# Patient Record
Sex: Male | Born: 1975 | Race: White | Hispanic: No | Marital: Married | Smoking: Never smoker
Health system: Southern US, Community
[De-identification: ages and names within clinical notes are randomized; demographics above are authoritative.]

## PROBLEM LIST (undated history)

## (undated) DIAGNOSIS — L4050 Arthropathic psoriasis, unspecified: Secondary | ICD-10-CM

## (undated) DIAGNOSIS — T7840XA Allergy, unspecified, initial encounter: Secondary | ICD-10-CM

## (undated) DIAGNOSIS — M722 Plantar fascial fibromatosis: Secondary | ICD-10-CM

## (undated) HISTORY — DX: Plantar fascial fibromatosis: M72.2

## (undated) HISTORY — PX: HERNIA REPAIR: SHX51

## (undated) HISTORY — PX: REFRACTIVE SURGERY: SHX103

## (undated) HISTORY — DX: Allergy, unspecified, initial encounter: T78.40XA

## (undated) HISTORY — DX: Arthropathic psoriasis, unspecified: L40.50

---

## 2013-11-14 DIAGNOSIS — J309 Allergic rhinitis, unspecified: Secondary | ICD-10-CM | POA: Insufficient documentation

## 2015-09-22 DIAGNOSIS — J029 Acute pharyngitis, unspecified: Secondary | ICD-10-CM | POA: Diagnosis not present

## 2015-09-22 DIAGNOSIS — R52 Pain, unspecified: Secondary | ICD-10-CM | POA: Diagnosis not present

## 2015-09-29 DIAGNOSIS — J3081 Allergic rhinitis due to animal (cat) (dog) hair and dander: Secondary | ICD-10-CM | POA: Diagnosis not present

## 2015-09-29 DIAGNOSIS — J301 Allergic rhinitis due to pollen: Secondary | ICD-10-CM | POA: Diagnosis not present

## 2015-09-29 DIAGNOSIS — J3089 Other allergic rhinitis: Secondary | ICD-10-CM | POA: Diagnosis not present

## 2015-10-03 DIAGNOSIS — J301 Allergic rhinitis due to pollen: Secondary | ICD-10-CM | POA: Diagnosis not present

## 2015-10-03 DIAGNOSIS — J3089 Other allergic rhinitis: Secondary | ICD-10-CM | POA: Diagnosis not present

## 2015-10-03 DIAGNOSIS — J3081 Allergic rhinitis due to animal (cat) (dog) hair and dander: Secondary | ICD-10-CM | POA: Diagnosis not present

## 2015-10-07 DIAGNOSIS — J3089 Other allergic rhinitis: Secondary | ICD-10-CM | POA: Diagnosis not present

## 2015-10-07 DIAGNOSIS — J301 Allergic rhinitis due to pollen: Secondary | ICD-10-CM | POA: Diagnosis not present

## 2015-10-07 DIAGNOSIS — J3081 Allergic rhinitis due to animal (cat) (dog) hair and dander: Secondary | ICD-10-CM | POA: Diagnosis not present

## 2015-10-10 DIAGNOSIS — J301 Allergic rhinitis due to pollen: Secondary | ICD-10-CM | POA: Diagnosis not present

## 2015-10-10 DIAGNOSIS — J3089 Other allergic rhinitis: Secondary | ICD-10-CM | POA: Diagnosis not present

## 2015-10-10 DIAGNOSIS — J3081 Allergic rhinitis due to animal (cat) (dog) hair and dander: Secondary | ICD-10-CM | POA: Diagnosis not present

## 2015-10-15 DIAGNOSIS — J3081 Allergic rhinitis due to animal (cat) (dog) hair and dander: Secondary | ICD-10-CM | POA: Diagnosis not present

## 2015-10-15 DIAGNOSIS — J3089 Other allergic rhinitis: Secondary | ICD-10-CM | POA: Diagnosis not present

## 2015-10-15 DIAGNOSIS — J301 Allergic rhinitis due to pollen: Secondary | ICD-10-CM | POA: Diagnosis not present

## 2015-10-21 DIAGNOSIS — J3089 Other allergic rhinitis: Secondary | ICD-10-CM | POA: Diagnosis not present

## 2015-10-21 DIAGNOSIS — J3081 Allergic rhinitis due to animal (cat) (dog) hair and dander: Secondary | ICD-10-CM | POA: Diagnosis not present

## 2015-10-21 DIAGNOSIS — J301 Allergic rhinitis due to pollen: Secondary | ICD-10-CM | POA: Diagnosis not present

## 2015-11-03 DIAGNOSIS — J301 Allergic rhinitis due to pollen: Secondary | ICD-10-CM | POA: Diagnosis not present

## 2015-11-03 DIAGNOSIS — J3089 Other allergic rhinitis: Secondary | ICD-10-CM | POA: Diagnosis not present

## 2015-11-03 DIAGNOSIS — H1045 Other chronic allergic conjunctivitis: Secondary | ICD-10-CM | POA: Diagnosis not present

## 2015-11-03 DIAGNOSIS — J3081 Allergic rhinitis due to animal (cat) (dog) hair and dander: Secondary | ICD-10-CM | POA: Diagnosis not present

## 2015-11-11 DIAGNOSIS — J301 Allergic rhinitis due to pollen: Secondary | ICD-10-CM | POA: Diagnosis not present

## 2015-11-11 DIAGNOSIS — J3081 Allergic rhinitis due to animal (cat) (dog) hair and dander: Secondary | ICD-10-CM | POA: Diagnosis not present

## 2015-11-11 DIAGNOSIS — J3089 Other allergic rhinitis: Secondary | ICD-10-CM | POA: Diagnosis not present

## 2015-11-21 DIAGNOSIS — J301 Allergic rhinitis due to pollen: Secondary | ICD-10-CM | POA: Diagnosis not present

## 2015-11-21 DIAGNOSIS — J3089 Other allergic rhinitis: Secondary | ICD-10-CM | POA: Diagnosis not present

## 2015-11-21 DIAGNOSIS — J3081 Allergic rhinitis due to animal (cat) (dog) hair and dander: Secondary | ICD-10-CM | POA: Diagnosis not present

## 2015-11-27 DIAGNOSIS — J3089 Other allergic rhinitis: Secondary | ICD-10-CM | POA: Diagnosis not present

## 2015-11-27 DIAGNOSIS — J301 Allergic rhinitis due to pollen: Secondary | ICD-10-CM | POA: Diagnosis not present

## 2015-11-27 DIAGNOSIS — J3081 Allergic rhinitis due to animal (cat) (dog) hair and dander: Secondary | ICD-10-CM | POA: Diagnosis not present

## 2015-12-03 DIAGNOSIS — J301 Allergic rhinitis due to pollen: Secondary | ICD-10-CM | POA: Diagnosis not present

## 2015-12-03 DIAGNOSIS — J3089 Other allergic rhinitis: Secondary | ICD-10-CM | POA: Diagnosis not present

## 2015-12-03 DIAGNOSIS — J3081 Allergic rhinitis due to animal (cat) (dog) hair and dander: Secondary | ICD-10-CM | POA: Diagnosis not present

## 2015-12-05 DIAGNOSIS — Z79899 Other long term (current) drug therapy: Secondary | ICD-10-CM | POA: Diagnosis not present

## 2015-12-05 DIAGNOSIS — L409 Psoriasis, unspecified: Secondary | ICD-10-CM | POA: Diagnosis not present

## 2015-12-05 DIAGNOSIS — M79674 Pain in right toe(s): Secondary | ICD-10-CM | POA: Diagnosis not present

## 2015-12-05 DIAGNOSIS — L4050 Arthropathic psoriasis, unspecified: Secondary | ICD-10-CM | POA: Diagnosis not present

## 2015-12-10 DIAGNOSIS — M79674 Pain in right toe(s): Secondary | ICD-10-CM | POA: Diagnosis not present

## 2015-12-10 DIAGNOSIS — M19071 Primary osteoarthritis, right ankle and foot: Secondary | ICD-10-CM | POA: Diagnosis not present

## 2015-12-15 DIAGNOSIS — J329 Chronic sinusitis, unspecified: Secondary | ICD-10-CM | POA: Diagnosis not present

## 2015-12-15 DIAGNOSIS — J029 Acute pharyngitis, unspecified: Secondary | ICD-10-CM | POA: Diagnosis not present

## 2015-12-17 DIAGNOSIS — J301 Allergic rhinitis due to pollen: Secondary | ICD-10-CM | POA: Diagnosis not present

## 2015-12-17 DIAGNOSIS — J3081 Allergic rhinitis due to animal (cat) (dog) hair and dander: Secondary | ICD-10-CM | POA: Diagnosis not present

## 2015-12-17 DIAGNOSIS — J3089 Other allergic rhinitis: Secondary | ICD-10-CM | POA: Diagnosis not present

## 2015-12-19 DIAGNOSIS — J301 Allergic rhinitis due to pollen: Secondary | ICD-10-CM | POA: Diagnosis not present

## 2015-12-19 DIAGNOSIS — J3089 Other allergic rhinitis: Secondary | ICD-10-CM | POA: Diagnosis not present

## 2015-12-19 DIAGNOSIS — J3081 Allergic rhinitis due to animal (cat) (dog) hair and dander: Secondary | ICD-10-CM | POA: Diagnosis not present

## 2016-01-14 DIAGNOSIS — J3089 Other allergic rhinitis: Secondary | ICD-10-CM | POA: Diagnosis not present

## 2016-01-14 DIAGNOSIS — J301 Allergic rhinitis due to pollen: Secondary | ICD-10-CM | POA: Diagnosis not present

## 2016-01-14 DIAGNOSIS — J3081 Allergic rhinitis due to animal (cat) (dog) hair and dander: Secondary | ICD-10-CM | POA: Diagnosis not present

## 2016-01-30 DIAGNOSIS — J3089 Other allergic rhinitis: Secondary | ICD-10-CM | POA: Diagnosis not present

## 2016-01-30 DIAGNOSIS — J3081 Allergic rhinitis due to animal (cat) (dog) hair and dander: Secondary | ICD-10-CM | POA: Diagnosis not present

## 2016-01-30 DIAGNOSIS — J301 Allergic rhinitis due to pollen: Secondary | ICD-10-CM | POA: Diagnosis not present

## 2016-02-06 DIAGNOSIS — J3089 Other allergic rhinitis: Secondary | ICD-10-CM | POA: Diagnosis not present

## 2016-02-06 DIAGNOSIS — J3081 Allergic rhinitis due to animal (cat) (dog) hair and dander: Secondary | ICD-10-CM | POA: Diagnosis not present

## 2016-02-06 DIAGNOSIS — J301 Allergic rhinitis due to pollen: Secondary | ICD-10-CM | POA: Diagnosis not present

## 2016-04-06 DIAGNOSIS — D171 Benign lipomatous neoplasm of skin and subcutaneous tissue of trunk: Secondary | ICD-10-CM | POA: Diagnosis not present

## 2016-04-06 DIAGNOSIS — Z0001 Encounter for general adult medical examination with abnormal findings: Secondary | ICD-10-CM | POA: Diagnosis not present

## 2016-04-06 DIAGNOSIS — L4050 Arthropathic psoriasis, unspecified: Secondary | ICD-10-CM | POA: Diagnosis not present

## 2016-04-06 DIAGNOSIS — M67471 Ganglion, right ankle and foot: Secondary | ICD-10-CM | POA: Diagnosis not present

## 2016-04-06 DIAGNOSIS — Z23 Encounter for immunization: Secondary | ICD-10-CM | POA: Diagnosis not present

## 2016-04-13 DIAGNOSIS — M67471 Ganglion, right ankle and foot: Secondary | ICD-10-CM | POA: Diagnosis not present

## 2016-04-13 DIAGNOSIS — M19071 Primary osteoarthritis, right ankle and foot: Secondary | ICD-10-CM | POA: Diagnosis not present

## 2016-04-28 DIAGNOSIS — J0110 Acute frontal sinusitis, unspecified: Secondary | ICD-10-CM | POA: Diagnosis not present

## 2016-05-11 DIAGNOSIS — J209 Acute bronchitis, unspecified: Secondary | ICD-10-CM | POA: Diagnosis not present

## 2016-06-09 DIAGNOSIS — J0101 Acute recurrent maxillary sinusitis: Secondary | ICD-10-CM | POA: Diagnosis not present

## 2016-06-11 DIAGNOSIS — L409 Psoriasis, unspecified: Secondary | ICD-10-CM | POA: Diagnosis not present

## 2016-06-11 DIAGNOSIS — Z79899 Other long term (current) drug therapy: Secondary | ICD-10-CM | POA: Diagnosis not present

## 2016-06-11 DIAGNOSIS — L4050 Arthropathic psoriasis, unspecified: Secondary | ICD-10-CM | POA: Diagnosis not present

## 2016-09-22 DIAGNOSIS — R0789 Other chest pain: Secondary | ICD-10-CM | POA: Diagnosis not present

## 2017-02-10 DIAGNOSIS — L409 Psoriasis, unspecified: Secondary | ICD-10-CM | POA: Diagnosis not present

## 2017-02-10 DIAGNOSIS — L4050 Arthropathic psoriasis, unspecified: Secondary | ICD-10-CM | POA: Diagnosis not present

## 2017-02-10 DIAGNOSIS — Z23 Encounter for immunization: Secondary | ICD-10-CM | POA: Diagnosis not present

## 2017-02-10 DIAGNOSIS — Z79899 Other long term (current) drug therapy: Secondary | ICD-10-CM | POA: Diagnosis not present

## 2017-03-06 DIAGNOSIS — M7731 Calcaneal spur, right foot: Secondary | ICD-10-CM | POA: Diagnosis not present

## 2017-03-06 DIAGNOSIS — M79671 Pain in right foot: Secondary | ICD-10-CM | POA: Diagnosis not present

## 2017-03-06 DIAGNOSIS — M26622 Arthralgia of left temporomandibular joint: Secondary | ICD-10-CM | POA: Diagnosis not present

## 2017-03-28 DIAGNOSIS — R030 Elevated blood-pressure reading, without diagnosis of hypertension: Secondary | ICD-10-CM | POA: Diagnosis not present

## 2017-03-28 DIAGNOSIS — J320 Chronic maxillary sinusitis: Secondary | ICD-10-CM | POA: Diagnosis not present

## 2017-05-13 DIAGNOSIS — Z713 Dietary counseling and surveillance: Secondary | ICD-10-CM | POA: Diagnosis not present

## 2017-05-13 DIAGNOSIS — Z136 Encounter for screening for cardiovascular disorders: Secondary | ICD-10-CM | POA: Diagnosis not present

## 2017-05-13 DIAGNOSIS — Z13220 Encounter for screening for lipoid disorders: Secondary | ICD-10-CM | POA: Diagnosis not present

## 2017-05-13 DIAGNOSIS — Z131 Encounter for screening for diabetes mellitus: Secondary | ICD-10-CM | POA: Diagnosis not present

## 2017-06-20 ENCOUNTER — Encounter: Payer: Self-pay | Admitting: Sports Medicine

## 2017-06-20 ENCOUNTER — Ambulatory Visit (INDEPENDENT_AMBULATORY_CARE_PROVIDER_SITE_OTHER): Payer: BLUE CROSS/BLUE SHIELD | Admitting: Sports Medicine

## 2017-06-20 DIAGNOSIS — L4050 Arthropathic psoriasis, unspecified: Secondary | ICD-10-CM

## 2017-06-20 DIAGNOSIS — M65949 Unspecified synovitis and tenosynovitis, unspecified hand: Secondary | ICD-10-CM | POA: Insufficient documentation

## 2017-06-20 DIAGNOSIS — M722 Plantar fascial fibromatosis: Secondary | ICD-10-CM

## 2017-06-20 DIAGNOSIS — M659 Synovitis and tenosynovitis, unspecified: Secondary | ICD-10-CM

## 2017-06-20 NOTE — Assessment & Plan Note (Signed)
Guided injection. Air heel brace, rehab exercises, return for custom orthotics.

## 2017-06-20 NOTE — Assessment & Plan Note (Signed)
Injection as above 

## 2017-06-20 NOTE — Progress Notes (Signed)
Subjective:    I'm seeing this patient as a consultation for:  Dr. Francee GentileAldona Ziolkowska  CC: Right heel pain  HPI: This is a pleasant 42 year old male with a history of psoriatic arthritis, currently on Biologics.  His most recent medication has provided some good relief of his rash.  Unfortunately over the past several weeks he is developed severe pain on the plantar aspect of his right heel, and lesser so on the left.  Worse with the first few steps in the morning, severe, persistent.  Has tried some stretches, over-the-counter NSAIDs, without improvement.  Pain radiates into the back of the heel.  No constitutional symptoms.  He is also noted some swelling of his right fourth PIP.  Moderate, persistent.  I reviewed the past medical history, family history, social history, surgical history, and allergies today and no changes were needed.  Please see the problem list section below in epic for further details.  Past Medical History: No past medical history on file. Past Surgical History: Past Surgical History:  Procedure Laterality Date  . HERNIA REPAIR     Social History: Social History   Socioeconomic History  . Marital status: Married    Spouse name: Tawny HoppingJenna Radabaugh  . Number of children: 2  . Years of education: None  . Highest education level: None  Social Needs  . Financial resource strain: None  . Food insecurity - worry: None  . Food insecurity - inability: None  . Transportation needs - medical: None  . Transportation needs - non-medical: None  Occupational History  . 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 Smoking status: Never Smoker  . Smokeless tobacco: Never Used  Substance and Sexual Activity  . Alcohol use: Yes  . Drug use: No  . Sexual activity: Yes  Other Topics Concern  . None  Social History Narrative  . None   Family History: History reviewed. No pertinent family history. Allergies: No Known Allergies Medications: See med  rec.  Review of Systems: No headache, visual changes, nausea, vomiting, diarrhea, constipation, dizziness, abdominal pain, skin rash, fevers, chills, night sweats, weight loss, swollen lymph nodes, body aches, joint swelling, muscle aches, chest pain, shortness of breath, mood changes, visual or auditory hallucinations.   Objective:   General: Well Developed, well nourished, and in no acute distress.  Neuro:  Extra-ocular muscles intact, able to move all 4 extremities, sensation grossly intact.  Deep tendon reflexes tested were normal. Psych: Alert and oriented, mood congruent with affect. ENT:  Ears and nose appear unremarkable.  Hearing grossly normal. Neck: Unremarkable overall appearance, trachea midline.  No visible thyroid enlargement. Eyes: Conjunctivae and lids appear unremarkable.  Pupils equal and round. Skin: Warm and dry, no rashes noted.  Cardiovascular: Pulses palpable, no extremity edema. Right foot: No visible erythema or swelling. Range of motion is full in all directions. Strength is 5/5 in all directions. No hallux valgus. No pes cavus or pes planus. No abnormal callus noted. No pain over the navicular prominence, or base of fifth metatarsal. Severe tenderness to palpation of the calcaneal insertion of plantar fascia. No pain at the Achilles insertion. No pain over the calcaneal bursa. No pain of the retrocalcaneal bursa. No tenderness to palpation over the tarsals, metatarsals, or phalanges. No hallux rigidus or limitus. No tenderness palpation over interphalangeal joints. No pain with compression of the metatarsal heads. Neurovascularly intact distally. Right hand: Fusiform swelling with palpable synovitis of the right fourth PIP.  No overlying erythema.  Procedure: Real-time Ultrasound Guided Injection of right plantar fascia Device: GE Logiq E  Verbal informed consent obtained.  Time-out conducted.  Noted no overlying erythema, induration, or other signs of  local infection.  Skin prepped in a sterile fashion.  Local anesthesia: Topical Ethyl chloride.  With sterile technique and under real time ultrasound guidance: Using a 25-gauge needle advanced just deep to the origin of the plantar fascia at the calcaneus, and injected 1 cc kenalog 40, 1 cc lidocaine, 1 cc bupivacaine. Completed without difficulty  Pain immediately resolved suggesting accurate placement of the medication.  Advised to call if fevers/chills, erythema, induration, drainage, or persistent bleeding.  Images permanently stored and available for review in the ultrasound unit.  Impression: Technically successful ultrasound guided injection.  Procedure: Real-time Ultrasound Guided Injection of right fourth PIP Device: GE Logiq E  Verbal informed consent obtained.  Time-out conducted.  Noted no overlying erythema, induration, or other signs of local infection.  Skin prepped in a sterile fashion.  Local anesthesia: Topical Ethyl chloride.  With sterile technique and under real time ultrasound guidance: 1/2 cc kenalog 40, 1/2 cc lidocaine injected easily, noted copious synovitis about this joint. Completed without difficulty  Pain immediately resolved suggesting accurate placement of the medication.  Advised to call if fevers/chills, erythema, induration, drainage, or persistent bleeding.  Images permanently stored and available for review in the ultrasound unit.  Impression: Technically successful ultrasound guided injection.  Impression and Recommendations:   This case required medical decision making of moderate complexity.  Synovitis of right fourth PIP Injection as above.  Plantar fasciitis, right Guided injection. Air heel brace, rehab exercises, return for custom orthotics. ___________________________________________ Ihor Austin. Benjamin Stain, M.D., ABFM., CAQSM. Primary Care and Sports Medicine [facility]  Adjunct Instructor of Family Medicine   [employer]

## 2017-06-24 ENCOUNTER — Ambulatory Visit (INDEPENDENT_AMBULATORY_CARE_PROVIDER_SITE_OTHER): Payer: BLUE CROSS/BLUE SHIELD | Admitting: Sports Medicine

## 2017-06-24 ENCOUNTER — Encounter: Payer: Self-pay | Admitting: Physician Assistant

## 2017-06-24 ENCOUNTER — Ambulatory Visit (INDEPENDENT_AMBULATORY_CARE_PROVIDER_SITE_OTHER): Payer: BLUE CROSS/BLUE SHIELD | Admitting: Physician Assistant

## 2017-06-24 VITALS — BP 129/87 | HR 97 | Ht 70.0 in | Wt 211.0 lb

## 2017-06-24 DIAGNOSIS — E782 Mixed hyperlipidemia: Secondary | ICD-10-CM

## 2017-06-24 DIAGNOSIS — M65949 Unspecified synovitis and tenosynovitis, unspecified hand: Secondary | ICD-10-CM

## 2017-06-24 DIAGNOSIS — R030 Elevated blood-pressure reading, without diagnosis of hypertension: Secondary | ICD-10-CM

## 2017-06-24 DIAGNOSIS — E6609 Other obesity due to excess calories: Secondary | ICD-10-CM | POA: Diagnosis not present

## 2017-06-24 DIAGNOSIS — M659 Synovitis and tenosynovitis, unspecified: Secondary | ICD-10-CM

## 2017-06-24 DIAGNOSIS — L4050 Arthropathic psoriasis, unspecified: Secondary | ICD-10-CM

## 2017-06-24 DIAGNOSIS — M722 Plantar fascial fibromatosis: Secondary | ICD-10-CM | POA: Diagnosis not present

## 2017-06-24 DIAGNOSIS — Z7689 Persons encountering health services in other specified circumstances: Secondary | ICD-10-CM | POA: Diagnosis not present

## 2017-06-24 MED ORDER — MELOXICAM 15 MG PO TABS: ORAL_TABLET | ORAL | 3 refills | Status: DC

## 2017-06-24 NOTE — Assessment & Plan Note (Signed)
Resolved after injection 

## 2017-06-24 NOTE — Progress Notes (Signed)
    Patient was fitted for a : standard, cushioned, semi-rigid orthotic. The orthotic was heated and afterward the patient stood on the orthotic blank positioned on the orthotic stand. The patient was positioned in subtalar neutral position and 10 degrees of ankle dorsiflexion in a weight bearing stance. After completion of molding, a stable base was applied to the orthotic blank. The blank was ground to a stable position for weight bearing. Size: 13 Base: White EVA Additional Posting and Padding: None The patient ambulated these, and they were very comfortable.  I spent 40 minutes with this patient, greater than 50% was face-to-face time counseling regarding the below diagnosis.  ___________________________________________ Thomas J. Thekkekandam, M.D., ABFM., CAQSM. Primary Care and Sports Medicine [facility]  Adjunct Instructor of Family Medicine  [employer]   

## 2017-06-24 NOTE — Assessment & Plan Note (Signed)
50% better after injection, continue air heel brace, custom orthotics created as above. Adding meloxicam, return in 1 month.

## 2017-06-24 NOTE — Progress Notes (Signed)
HPI:                                                                Craig Campbell is a 42 y.o. male who presents to [facility]: Primary Care Sports Medicine today to establish care  Current concerns: none   Depression screen [facility] 2/9 06/24/2017  Decreased Interest 0  Down, Depressed, Hopeless 0  PHQ - 2 Score 0    No flowsheet data found.    Past Medical History:  Diagnosis Date  . Allergy   . Plantar fasciitis   . Psoriatic arthritis [facility])    Past Surgical History:  Procedure Laterality Date  . HERNIA REPAIR    . REFRACTIVE SURGERY     Social History   Tobacco Use  . Smoking status: Never Smoker  . Smokeless tobacco: Never Used  Substance Use Topics  . Alcohol use: Yes    Alcohol/week: 1.2 oz    Types: 2 Standard drinks or equivalent per week   family history includes CAD in his paternal uncle; Depression in his mother; Diabetes in his mother; Heart attack in his paternal uncle; Hyperlipidemia in his mother; Hypertension in his mother; Stroke in his mother.    ROS: negative except as noted in the HPI  Medications: Current Outpatient Medications  Medication Sig Dispense Refill  . Secukinumab 150 MG/ML SOAJ Inject into the skin.     No current facility-administered medications for this visit.    No Known Allergies     Objective:  BP 129/87   Pulse 97   Ht 5\' 10"  (1.778 m)   Wt 211 lb (95.7 kg)   BMI 30.28 kg/m  Gen:  alert, not ill-appearing, no distress, appropriate for age, obese male HEENT: head normocephalic without obvious abnormality, conjunctiva and cornea clear, trachea midline Pulm: Normal work of breathing, normal phonation Neuro: alert and oriented x 3, no tremor MSK: extremities atraumatic, normal gait and station Skin: intact, no rashes on exposed skin, no jaundice, no cyanosis Psych: well-groomed, cooperative, good eye contact, euthymic mood, affect mood-congruent, speech is articulate, and thought processes clear  and goal-directed    No results found for this or any previous visit (from the past 72 hour(s)). No results found.    Assessment and Plan: 42 y.o. male with   1. Encounter to establish care - reviewed PMh, PSH, PFH, medications and allergies - reviewed health maintenance - personally reviewed labs in Care Everywhere from 12.2018 - influenza UTD - negative PHQ9  2. Psoriatic arthritis (HCC) Followed by Dr. Francee Gentile, [place]  3. Elevated blood pressure reading BP Readings from Last 3 Encounters:  06/24/17 129/87  06/24/17 129/87  06/20/17 (!) 145/66  - active surveillance, follow-up every 6 months - counseled on therapeutic lifestyle changes   4. Class 1 obesity due to excess calories without serious comorbidity in adult, unspecified BMI Wt Readings from Last 3 Encounters:  06/24/17 211 lb (95.7 kg)  06/24/17 211 lb (95.7 kg)  06/20/17 212 lb (96.2 kg)  - counseled on weight loss through decreasing caloric intake and increasing aerobic exercise - 1800 calorie DASH/Mediterranean diet - minimum 120 minutes of cardio per week   5. Mixed hyperlipidemia - 10 yr ASCVD risk 1.9% - LDL 160, candidate for  statin therapy - counseled on therapeutic lifestyle changes - recheck fasting lipids in 6 months     Patient education and anticipatory guidance given Patient agrees with treatment plan Follow-up in 6 months or sooner as needed if symptoms worsen or fail to improve  Levonne Hubertharley E. Jermale Crass PA-C

## 2017-06-24 NOTE — Patient Instructions (Addendum)
I have also ordered fasting labs. The lab is a walk-in open M-F 7:30a-4:30p (closed 12:30-1:30p). Nothing to eat or drink after midnight or at least 8 hours before your blood draw. You can have water and your medications.   For your blood pressure: - Goal <130/80 - monitor and log blood pressures at home - check around the same time each day in a relaxed setting - Limit salt to <2000 mg/day - Follow DASH eating plan - limit alcohol to 2 standard drinks per day for men and 1 per day for women - avoid tobacco products - weight loss: 7% of current body weight - follow-up every 6 months for your blood pressure    Physical Activity Recommendations for modifying lipids and lowering blood pressure Engage in aerobic physical activity to reduce LDL-cholesterol, non-HDL-cholesterol, and blood pressure  Frequency: 3-4 sessions per week  Intensity: moderate to vigorous  Duration: 40 minutes on average  Physical Activity Recommendations for secondary prevention 1. Aerobic exercise  Frequency: 3-5 sessions per week  Intensity: 50-80% capacity  Duration: 20 - 60 minutes  Examples: walking, treadmill, cycling, rowing, stair climbing, and arm/leg ergometry  2. Resistance exercise  Frequency: 2-3 sessions per week  Intensity: 10-15 repetitions/set to moderate fatigue  Duration: 1-3 sets of 8-10 upper and lower body exercises  Examples: calisthenics, elastic bands, cuff/hand weights, dumbbels, free weights, wall pulleys, and weight machines  Heart-Healthy Lifestyle  Eating a diet rich in vegetables, fruits and whole grains: also includes low-fat dairy products, poultry, fish, legumes, and nuts; limit intake of sweets, sugar-sweetened beverages and red meats  Getting regular exercise  Maintaining a healthy weight  Not smoking or getting help quitting  Staying on top of your health; for some people, lifestyle changes alone may not be enough to prevent a heart attack or stroke. In  these cases, taking a statin at the right dose will most likely be necessary

## 2017-07-22 ENCOUNTER — Ambulatory Visit (INDEPENDENT_AMBULATORY_CARE_PROVIDER_SITE_OTHER): Payer: BLUE CROSS/BLUE SHIELD | Admitting: Sports Medicine

## 2017-07-22 ENCOUNTER — Ambulatory Visit (INDEPENDENT_AMBULATORY_CARE_PROVIDER_SITE_OTHER): Payer: BLUE CROSS/BLUE SHIELD

## 2017-07-22 ENCOUNTER — Encounter: Payer: Self-pay | Admitting: Sports Medicine

## 2017-07-22 VITALS — BP 128/87 | HR 112 | Resp 18 | Wt 206.0 lb

## 2017-07-22 DIAGNOSIS — M7731 Calcaneal spur, right foot: Secondary | ICD-10-CM

## 2017-07-22 DIAGNOSIS — M722 Plantar fascial fibromatosis: Secondary | ICD-10-CM

## 2017-07-22 DIAGNOSIS — M19071 Primary osteoarthritis, right ankle and foot: Secondary | ICD-10-CM | POA: Diagnosis not present

## 2017-07-22 DIAGNOSIS — J020 Streptococcal pharyngitis: Secondary | ICD-10-CM | POA: Insufficient documentation

## 2017-07-22 DIAGNOSIS — J029 Acute pharyngitis, unspecified: Secondary | ICD-10-CM | POA: Diagnosis not present

## 2017-07-22 LAB — POCT RAPID STREP A (OFFICE): Rapid Strep A Screen: POSITIVE — AB

## 2017-07-22 MED ORDER — HYDROCODONE-ACETAMINOPHEN 5-325 MG PO TABS: 1.0000 | ORAL_TABLET | Freq: Three times a day (TID) | ORAL | 0 refills | Status: DC | PRN

## 2017-07-22 MED ORDER — PREDNISONE 50 MG PO TABS: ORAL_TABLET | ORAL | 0 refills | Status: DC

## 2017-07-22 MED ORDER — PENICILLIN G BENZATHINE 1200000 UNIT/2ML IM SUSP
1.2000 10*6.[IU] | Freq: Once | INTRAMUSCULAR | Status: AC
  Administered 2017-07-22: 1.2 10*6.[IU] via INTRAMUSCULAR

## 2017-07-22 NOTE — Progress Notes (Signed)
Subjective:    CC: Foot pain  HPI: This is a pleasant 42 year old male, he continues to have right foot pain, although is gone from his calcaneal insertion of his plantar fascia.  He now has pain over the medial and lateral aspects of the calcaneus, moderate, persistent without radiation.  Worse with weightbearing.  In addition he is developed a severe sore throat, his daughter was tested but negative for strep, he denies a cough, has significant fatigue, subjective constitutional symptoms.  I reviewed the past medical history, family history, social history, surgical history, and allergies today and no changes were needed.  Please see the problem list section below in epic for further details.  Past Medical History: Past Medical History:  Diagnosis Date  . Allergy   . Plantar fasciitis   . Psoriatic arthritis [facility])    Past Surgical History: Past Surgical History:  Procedure Laterality Date  . HERNIA REPAIR    . REFRACTIVE SURGERY     Social History: Social History   Socioeconomic History  . Marital status: Married    Spouse name: Mckyle Solanki  . Number of children: 2  . Years of education: None  . Highest education level: None  Social Needs  . Financial resource strain: None  . Food insecurity - worry: None  . Food insecurity - inability: None  . Transportation needs - medical: None  . Transportation needs - non-medical: None  Occupational History  . 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 Smoking status: Never Smoker  . Smokeless tobacco: Never Used  Substance and Sexual Activity  . Alcohol use: Yes    Alcohol/week: 1.2 oz    Types: 2 Standard drinks or equivalent per week  . Drug use: No  . Sexual activity: Yes  Other Topics Concern  . None  Social History Narrative  . None   Family History: Family History  Problem Relation Age of Onset  . Hyperlipidemia Mother   . Stroke Mother   . Hypertension Mother   . Diabetes Mother   .  Depression Mother   . Heart attack Paternal Uncle   . CAD Paternal Uncle    Allergies: No Known Allergies Medications: See med rec.  Review of Systems: No fevers, chills, night sweats, weight loss, chest pain, or shortness of breath.   Objective:    General: Well Developed, well nourished, and in no acute distress.  Neuro: Alert and oriented x3, extra-ocular muscles intact, sensation grossly intact.  HEENT: Normocephalic, atraumatic, pupils equal round reactive to light, neck supple, no masses, thyroid nonpalpable.  Nasopharynx, ear canals unremarkable, oropharynx shows erythematous, enlarged tonsils with visible exudates, there is also tender bilateral cervical lymphadenopathy Skin: Warm and dry, no rashes. Cardiac: Regular rate and rhythm, no murmurs rubs or gallops, no lower extremity edema.  Respiratory: Clear to auscultation bilaterally. Not using accessory muscles, speaking in full sentences. Right foot: No visible erythema or swelling. Range of motion is full in all directions. Strength is 5/5 in all directions. No hallux valgus. No pes cavus or pes planus. No abnormal callus noted. No pain over the navicular prominence, or base of fifth metatarsal. No tenderness to palpation of the calcaneal insertion of plantar fascia. No pain at the Achilles insertion. No pain over the calcaneal bursa. No pain of the retrocalcaneal bursa. Tender to palpation on the calcaneus with a positive calcaneal squeeze test. Hallux limitus with enlarged first MTP. No tenderness palpation over interphalangeal joints. No pain with compression of the  metatarsal heads. Neurovascularly intact distally.   Rapid strep test is positive.  Impression and Recommendations:    Plantar fasciitis, right Persistent pain at the right heel, this time with a positive calcaneal squeeze test. My concern is for calcaneal stress injury. He is currently finishing up the loading dose of his Cosentyx. Adding a 5-day  burst of prednisone, Cam boot, hydrocodone. Return to see me in 3 weeks. I am going to MRI his right foot.  Streptococcal pharyngitis Penicillin 1,200,000 units intramuscular. Wear a mask at work, follow-up with PCP for this. ___________________________________________ Ihor Austinhomas J. Benjamin Stainhekkekandam, M.D., ABFM., CAQSM. Primary Care and Sports Medicine [facility]  Adjunct Instructor of Family Medicine  [employer]

## 2017-07-22 NOTE — Assessment & Plan Note (Signed)
Penicillin 1,200,000 units intramuscular. Wear a mask at work, follow-up with PCP for this.

## 2017-07-22 NOTE — Assessment & Plan Note (Signed)
Persistent pain at the right heel, this time with a positive calcaneal squeeze test. My concern is for calcaneal stress injury. He is currently finishing up the loading dose of his Cosentyx. Adding a 5-day burst of prednisone, Cam boot, hydrocodone. Return to see me in 3 weeks. I am going to MRI his right foot.

## 2017-07-22 NOTE — Addendum Note (Signed)
Addended by: Monica BectonHEKKEKANDAM, THOMAS J on: 07/22/2017 04:33 PM   Modules accepted: Orders

## 2017-08-01 ENCOUNTER — Ambulatory Visit (INDEPENDENT_AMBULATORY_CARE_PROVIDER_SITE_OTHER): Payer: BLUE CROSS/BLUE SHIELD

## 2017-08-01 DIAGNOSIS — M722 Plantar fascial fibromatosis: Secondary | ICD-10-CM

## 2017-08-01 DIAGNOSIS — M7671 Peroneal tendinitis, right leg: Secondary | ICD-10-CM | POA: Diagnosis not present

## 2017-08-01 DIAGNOSIS — R600 Localized edema: Secondary | ICD-10-CM | POA: Diagnosis not present

## 2017-08-08 ENCOUNTER — Telehealth: Payer: Self-pay

## 2017-08-08 DIAGNOSIS — M722 Plantar fascial fibromatosis: Secondary | ICD-10-CM

## 2017-08-08 NOTE — Telephone Encounter (Signed)
Pt left VM asking for a refill of pain medication. Please advise.

## 2017-08-09 MED ORDER — HYDROCODONE-ACETAMINOPHEN 5-325 MG PO TABS: 1.0000 | ORAL_TABLET | Freq: Three times a day (TID) | ORAL | 0 refills | Status: DC | PRN

## 2017-08-09 NOTE — Telephone Encounter (Signed)
Pt.notified

## 2017-08-09 NOTE — Telephone Encounter (Signed)
Done

## 2017-08-15 ENCOUNTER — Ambulatory Visit (INDEPENDENT_AMBULATORY_CARE_PROVIDER_SITE_OTHER): Payer: BLUE CROSS/BLUE SHIELD | Admitting: Sports Medicine

## 2017-08-15 ENCOUNTER — Encounter: Payer: Self-pay | Admitting: Sports Medicine

## 2017-08-15 DIAGNOSIS — L4050 Arthropathic psoriasis, unspecified: Secondary | ICD-10-CM | POA: Diagnosis not present

## 2017-08-15 MED ORDER — PREDNISONE 10 MG (48) PO TBPK
ORAL_TABLET | Freq: Every day | ORAL | 0 refills | Status: DC
Start: 2017-08-15 — End: 2017-08-26

## 2017-08-15 NOTE — Assessment & Plan Note (Addendum)
With multiple edematous bones, plantar fasciitis, arthritis and synovitis on MRI this is likely a byproduct of uncontrolled psoriatic arthritis. He is going to be going up on his dose on Cosentyx. Adding some prednisone in a taper. Foot is very painful, we are going to proceed with 2 weeks of cast immobilization with a postop shoe for a walker. Return in 2 weeks for cast removal.

## 2017-08-15 NOTE — Progress Notes (Signed)
Subjective:    CC: Follow-up  HPI: Craig LericheMark returns to follow-up his MRI, he does have psoriatic arthritis, under inadequate control with low-dose Cosentyx.  We did a plantar fascia injection with mild relief, steroid burst with good relief, the MRI showed multiple pathologic changes likely related to psoriatic arthritis.  At this point he is had persistent pain, this time at the big toe but also at the heel.  Agreeable to do a cast.  He does have another appointment coming up for a second opinion with a different is getting a delivery with the higher dose of Cosentyx.   I reviewed the past medical history, family history, social history, surgical history, and allergies today and no changes were needed.  Please see the problem list section below in epic for further details.  Past Medical History: Past Medical History:  Diagnosis Date  . Allergy   . Plantar fasciitis   . Psoriatic arthritis [facility](HCC)    Past Surgical History: Past Surgical History:  Procedure Laterality Date  . HERNIA REPAIR    . REFRACTIVE SURGERY     Social History: Social History   Socioeconomic History  . Marital status: Married    Spouse name: Craig HoppingJenna Campbell  . Number of children: 2  . Years of education: None  . Highest education level: None  Social Needs  . Financial resource strain: None  . Food insecurity - worry: None  . Food insecurity - inability: None  . Transportation needs - medical: None  . Transportation needs - non-medical: None  Occupational History  . 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 Smoking status: Never Smoker  . Smokeless tobacco: Never Used  Substance and Sexual Activity  . Alcohol use: Yes    Alcohol/week: 1.2 oz    Types: 2 Standard drinks or equivalent per week  . Drug use: No  . Sexual activity: Yes  Other Topics Concern  . None  Social History Narrative  . None   Family History: Family History  Problem Relation Age of Onset  . Hyperlipidemia Mother     . Stroke Mother   . Hypertension Mother   . Diabetes Mother   . Depression Mother   . Heart attack Paternal Uncle   . CAD Paternal Uncle    Allergies: No Known Allergies Medications: See med rec.  Review of Systems: No fevers, chills, night sweats, weight loss, chest pain, or shortness of breath.   Objective:    General: Well Developed, well nourished, and in no acute distress.  Neuro: Alert and oriented x3, extra-ocular muscles intact, sensation grossly intact.  HEENT: Normocephalic, atraumatic, pupils equal round reactive to light, neck supple, no masses, no lymphadenopathy, thyroid nonpalpable.  Skin: Warm and dry, no rashes. Cardiac: Regular rate and rhythm, no murmurs rubs or gallops, no lower extremity edema.  Respiratory: Clear to auscultation bilaterally. Not using accessory muscles, speaking in full sentences. Right foot: No visible erythema or swelling. Range of motion is full in all directions. Strength is 5/5 in all directions. No hallux valgus. No pes cavus or pes planus. No abnormal callus noted. No pain over the navicular prominence, or base of fifth metatarsal. No tenderness to palpation of the calcaneal insertion of plantar fascia. No pain at the Achilles insertion. No pain over the calcaneal bursa. No pain of the retrocalcaneal bursa. Tenderness over the heel, first MTP, fifth MTP. No hallux rigidus or limitus. No tenderness palpation over interphalangeal joints. No pain with compression of the metatarsal  heads. Neurovascularly intact distally.  Short leg walking cast placed  Short leg walking cast placed.  Impression and Recommendations:    Psoriatic arthritis (HCC) With multiple edematous bones, plantar fasciitis, arthritis and synovitis on MRI this is likely a byproduct of uncontrolled psoriatic arthritis. He is going to be going up on his dose on Cosentyx. Adding some prednisone in a taper. Foot is very painful, we are going to proceed with 2  weeks of cast immobilization with a postop shoe for a walker. Return in 2 weeks for cast removal. ___________________________________________ Craig Campbell. Craig Campbell, M.D., ABFM., CAQSM. Primary Care and Sports Medicine [facility]  Adjunct Instructor of Family Medicine  [employer]

## 2017-08-17 DIAGNOSIS — L409 Psoriasis, unspecified: Secondary | ICD-10-CM | POA: Diagnosis not present

## 2017-08-17 DIAGNOSIS — Z79899 Other long term (current) drug therapy: Secondary | ICD-10-CM | POA: Diagnosis not present

## 2017-08-17 DIAGNOSIS — G4700 Insomnia, unspecified: Secondary | ICD-10-CM | POA: Diagnosis not present

## 2017-08-17 DIAGNOSIS — L4050 Arthropathic psoriasis, unspecified: Secondary | ICD-10-CM | POA: Diagnosis not present

## 2017-08-24 ENCOUNTER — Telehealth: Payer: Self-pay

## 2017-08-24 DIAGNOSIS — M722 Plantar fascial fibromatosis: Secondary | ICD-10-CM

## 2017-08-24 MED ORDER — HYDROCODONE-ACETAMINOPHEN 5-325 MG PO TABS: 1.0000 | ORAL_TABLET | Freq: Three times a day (TID) | ORAL | 0 refills | Status: DC | PRN

## 2017-08-24 NOTE — Telephone Encounter (Signed)
Pt would like a refill of pain meds. Please advise.

## 2017-08-24 NOTE — Telephone Encounter (Signed)
Done

## 2017-08-26 ENCOUNTER — Encounter: Payer: Self-pay | Admitting: Sports Medicine

## 2017-08-26 ENCOUNTER — Ambulatory Visit (INDEPENDENT_AMBULATORY_CARE_PROVIDER_SITE_OTHER): Payer: BLUE CROSS/BLUE SHIELD | Admitting: Sports Medicine

## 2017-08-26 DIAGNOSIS — L4050 Arthropathic psoriasis, unspecified: Secondary | ICD-10-CM | POA: Diagnosis not present

## 2017-08-26 NOTE — Progress Notes (Signed)
Subjective:    CC: Follow-up  HPI: Craig LericheMark returns, he is a pleasant 42 year old male, we saw him for severe ankle pain, after failure of conservative measures and MRI showed multiple edematous bones, plantar fasciitis, all likely complications and presentations of his psoriatic arthritis, he was initially on the low dose of Cosentyx, recently increased to a higher dose.  We had him with high-dose prednisone, and cast immobilization.  He returns today pain-free and eager to get the cast off.  I reviewed the past medical history, family history, social history, surgical history, and allergies today and no changes were needed.  Please see the problem list section below in epic for further details.  Past Medical History: Past Medical History:  Diagnosis Date  . Allergy   . Plantar fasciitis   . Psoriatic arthritis [facility](HCC)    Past Surgical History: Past Surgical History:  Procedure Laterality Date  . HERNIA REPAIR    . REFRACTIVE SURGERY     Social History: Social History   Socioeconomic History  . Marital status: Married    Spouse name: Tawny HoppingJenna Goral  . Number of children: 2  . Years of education: Not on file  . Highest education level: Not on file  Occupational History  . Occupation: Education officer, communityales Manager [employer]  Social Needs  . Financial resource strain: Not on file  . Food insecurity:    Worry: Not on file    Inability: Not on file  . Transportation needs:    Medical: Not on file    Non-medical: Not on file  Tobacco Use  . Smoking status: Never Smoker  . Smokeless tobacco: Never Used  Substance and Sexual Activity  . Alcohol use: Yes    Alcohol/week: 1.2 oz    Types: 2 Standard drinks or equivalent per week  . Drug use: No  . Sexual activity: Yes  Lifestyle  . Physical activity:    Days per week: Not on file    Minutes per session: Not on file  . Stress: Not on file  Relationships  . Social connections:    Talks on phone: Not on file    Gets together: Not on  file    Attends religious service: Not on file    Active member of club or organization: Not on file    Attends meetings of clubs or organizations: Not on file    Relationship status: Not on file  Other Topics Concern  . Not on file  Social History Narrative  . Not on file   Family History: Family History  Problem Relation Age of Onset  . Hyperlipidemia Mother   . Stroke Mother   . Hypertension Mother   . Diabetes Mother   . Depression Mother   . Heart attack Paternal Uncle   . CAD Paternal Uncle    Allergies: No Known Allergies Medications: See med rec.  Review of Systems: No fevers, chills, night sweats, weight loss, chest pain, or shortness of breath.   Objective:    General: Well Developed, well nourished, and in no acute distress.  Neuro: Alert and oriented x3, extra-ocular muscles intact, sensation grossly intact.  HEENT: Normocephalic, atraumatic, pupils equal round reactive to light, neck supple, no masses, no lymphadenopathy, thyroid nonpalpable.  Skin: Warm and dry, no rashes. Cardiac: Regular rate and rhythm, no murmurs rubs or gallops, no lower extremity edema.  Respiratory: Clear to auscultation bilaterally. Not using accessory muscles, speaking in full sentences. Right ankle: No visible erythema or swelling. Range of motion  is full in all directions. Strength is 5/5 in all directions. Stable lateral and medial ligaments; squeeze test and kleiger test unremarkable; Talar dome nontender; No pain at base of 5th MT; No tenderness over cuboid; No tenderness over N spot or navicular prominence No tenderness on posterior aspects of lateral and medial malleolus No sign of peroneal tendon subluxations; Negative tarsal tunnel tinel's Able to walk 4 steps.  Impression and Recommendations:    Psoriatic arthritis (HCC) Had multiple edematous bones, plantar fasciitis, arthritis and synovitis in the MRI likely a byproduct of his uncontrolled psoriatic  arthritis. Dose of Cosentyx was increased, we added prednisone in a taper as well as 2 weeks nearly of cast immobilization with a postop shoe. He returns today nearly completely symptom-free, cast is removed, doing well, keep additional follow-up with rheumatology.  I spent 25 minutes with this patient, greater than 50% was face-to-face time counseling regarding the above diagnoses ___________________________________________ Ihor Austin. Benjamin Stain, M.D., ABFM., CAQSM. Primary Care and Sports Medicine [facility]  Adjunct Instructor of Family Medicine  [employer]

## 2017-08-26 NOTE — Assessment & Plan Note (Signed)
Had multiple edematous bones, plantar fasciitis, arthritis and synovitis in the MRI likely a byproduct of his uncontrolled psoriatic arthritis. Dose of Cosentyx was increased, we added prednisone in a taper as well as 2 weeks nearly of cast immobilization with a postop shoe. He returns today nearly completely symptom-free, cast is removed, doing well, keep additional follow-up with rheumatology.

## 2017-08-29 ENCOUNTER — Ambulatory Visit: Payer: BLUE CROSS/BLUE SHIELD | Admitting: Sports Medicine

## 2017-09-05 ENCOUNTER — Telehealth: Payer: Self-pay

## 2017-09-05 DIAGNOSIS — M722 Plantar fascial fibromatosis: Secondary | ICD-10-CM

## 2017-09-05 MED ORDER — HYDROCODONE-ACETAMINOPHEN 5-325 MG PO TABS: 1.0000 | ORAL_TABLET | Freq: Three times a day (TID) | ORAL | 0 refills | Status: DC | PRN

## 2017-09-05 NOTE — Telephone Encounter (Signed)
Refilled but we need to start tapering down on quantity

## 2017-09-05 NOTE — Telephone Encounter (Signed)
Pt would like another refill of pain medications. Please advise

## 2017-09-20 ENCOUNTER — Telehealth: Payer: Self-pay | Admitting: Sports Medicine

## 2017-09-20 MED ORDER — TRAMADOL HCL 50 MG PO TABS: 50.0000 mg | ORAL_TABLET | Freq: Three times a day (TID) | ORAL | 0 refills | Status: AC | PRN

## 2017-09-20 NOTE — Telephone Encounter (Signed)
I am going to drop him down to tramadol, If he continues to need pain medication we probably need to enlist the help of a pain management provider.

## 2017-09-20 NOTE — Telephone Encounter (Signed)
Pt called and is wanting to know if he can get a refill on the hydrocodone. Thanks

## 2017-09-21 NOTE — Telephone Encounter (Signed)
Called and let pt know medication was sent to pharm and if he is continuing to have issue that he may need to go to pain management. Thanks

## 2017-09-26 ENCOUNTER — Telehealth: Payer: Self-pay | Admitting: Sports Medicine

## 2017-09-26 DIAGNOSIS — L4059 Other psoriatic arthropathy: Secondary | ICD-10-CM | POA: Diagnosis not present

## 2017-09-26 MED ORDER — PREDNISONE 10 MG (48) PO TBPK: ORAL_TABLET | Freq: Every day | ORAL | 0 refills | Status: AC

## 2017-09-26 NOTE — Telephone Encounter (Signed)
Received a call from [facility] boss, severe ankle pain.  Had multiple edematous bones, plantar fasciitis, arthritis and synovitis in the MRI, likely from uncontrolled psoriatic arthritis, he was pain-free about a month ago, now having a recurrence of discomfort, adding a taper of prednisone, he does need to get in with his rheumatologist as well to discuss better treatment.

## 2017-10-16 ENCOUNTER — Other Ambulatory Visit: Payer: Self-pay | Admitting: Sports Medicine

## 2017-10-16 DIAGNOSIS — M722 Plantar fascial fibromatosis: Secondary | ICD-10-CM

## 2017-10-17 NOTE — Telephone Encounter (Signed)
[facility] requesting RF on Mobic.  Last OV 08-26-17 Last RF 06-24-17 for # 30 with 3 RF  No upcoming appts scheduled  RX pended, please review request and send if appropriate.

## 2017-10-31 DIAGNOSIS — R0982 Postnasal drip: Secondary | ICD-10-CM | POA: Diagnosis not present

## 2017-10-31 DIAGNOSIS — R05 Cough: Secondary | ICD-10-CM | POA: Diagnosis not present

## 2017-12-28 DIAGNOSIS — M2550 Pain in unspecified joint: Secondary | ICD-10-CM | POA: Diagnosis not present

## 2017-12-28 DIAGNOSIS — L4059 Other psoriatic arthropathy: Secondary | ICD-10-CM | POA: Diagnosis not present

## 2018-01-12 DIAGNOSIS — H1031 Unspecified acute conjunctivitis, right eye: Secondary | ICD-10-CM | POA: Diagnosis not present

## 2018-01-12 DIAGNOSIS — B9689 Other specified bacterial agents as the cause of diseases classified elsewhere: Secondary | ICD-10-CM | POA: Diagnosis not present

## 2018-01-12 DIAGNOSIS — J069 Acute upper respiratory infection, unspecified: Secondary | ICD-10-CM | POA: Diagnosis not present

## 2018-03-14 ENCOUNTER — Emergency Department (HOSPITAL_BASED_OUTPATIENT_CLINIC_OR_DEPARTMENT_OTHER): Payer: BLUE CROSS/BLUE SHIELD

## 2018-03-14 ENCOUNTER — Other Ambulatory Visit: Payer: Self-pay

## 2018-03-14 ENCOUNTER — Encounter (HOSPITAL_BASED_OUTPATIENT_CLINIC_OR_DEPARTMENT_OTHER): Payer: Self-pay

## 2018-03-14 ENCOUNTER — Emergency Department (HOSPITAL_BASED_OUTPATIENT_CLINIC_OR_DEPARTMENT_OTHER)
Admission: EM | Admit: 2018-03-14 | Discharge: 2018-03-14 | Disposition: A | Discharge status: Home / Self Care | Payer: BLUE CROSS/BLUE SHIELD | Attending: Emergency Medicine | Admitting: Emergency Medicine

## 2018-03-14 DIAGNOSIS — R51 Headache: Secondary | ICD-10-CM | POA: Insufficient documentation

## 2018-03-14 DIAGNOSIS — L4050 Arthropathic psoriasis, unspecified: Secondary | ICD-10-CM | POA: Insufficient documentation

## 2018-03-14 DIAGNOSIS — R519 Headache, unspecified: Secondary | ICD-10-CM

## 2018-03-14 DIAGNOSIS — Z79899 Other long term (current) drug therapy: Secondary | ICD-10-CM | POA: Insufficient documentation

## 2018-03-14 LAB — BASIC METABOLIC PANEL
Anion gap: 11 (ref 5–15)
BUN: 14 mg/dL (ref 6–20)
CO2: 25 mmol/L (ref 22–32)
Calcium: 9.8 mg/dL (ref 8.9–10.3)
Chloride: 99 mmol/L (ref 98–111)
Creatinine, Ser: 0.76 mg/dL (ref 0.61–1.24)
GFR calc Af Amer: >60 mL/min (ref >60)
GFR calc non Af Amer: >60 mL/min (ref >60)
Glucose, Bld: 98 mg/dL (ref 70–99)
Potassium: 4 mmol/L (ref 3.5–5.1)
Sodium: 135 mmol/L (ref 135–145)

## 2018-03-14 LAB — CBC WITH DIFFERENTIAL/PLATELET
Abs Immature Granulocytes: 0.02 10*3/uL (ref 0.00–0.07)
Basophils Absolute: 0 10*3/uL (ref 0.0–0.1)
Basophils Relative: 1 %
Eosinophils Absolute: 0.1 10*3/uL (ref 0.0–0.5)
Eosinophils Relative: 1 %
HCT: 48.7 % (ref 39.0–52.0)
Hemoglobin: 16.5 g/dL (ref 13.0–17.0)
Immature Granulocytes: 0 %
Lymphocytes Relative: 24 %
Lymphs Abs: 1.2 10*3/uL (ref 0.7–4.0)
MCH: 31.7 pg (ref 26.0–34.0)
MCHC: 33.9 g/dL (ref 30.0–36.0)
MCV: 93.7 fL (ref 80.0–100.0)
Monocytes Absolute: 0.2 10*3/uL (ref 0.1–1.0)
Monocytes Relative: 4 %
Neutro Abs: 3.7 10*3/uL (ref 1.7–7.7)
Neutrophils Relative %: 70 %
Platelets: 218 10*3/uL (ref 150–400)
RBC: 5.2 MIL/uL (ref 4.22–5.81)
RDW: 12.6 % (ref 11.5–15.5)
WBC: 5.2 10*3/uL (ref 4.0–10.5)
nRBC: 0 % (ref 0.0–0.2)

## 2018-03-14 MED ORDER — IOPAMIDOL (ISOVUE-370) INJECTION 76%
100.0000 mL | Freq: Once | INTRAVENOUS | Status: AC | PRN
  Administered 2018-03-14: 80 mL via INTRAVENOUS

## 2018-03-14 MED ORDER — SODIUM CHLORIDE 0.9 % IV BOLUS
1000.0000 mL | Freq: Once | INTRAVENOUS | Status: AC
  Administered 2018-03-14: 1000 mL via INTRAVENOUS

## 2018-03-14 MED ORDER — PROCHLORPERAZINE EDISYLATE 10 MG/2ML IJ SOLN
10.0000 mg | Freq: Once | INTRAMUSCULAR | Status: AC
  Administered 2018-03-14: 10 mg via INTRAVENOUS
  Filled 2018-03-14: qty 2

## 2018-03-14 MED ORDER — DIPHENHYDRAMINE HCL 50 MG/ML IJ SOLN
INTRAMUSCULAR | Status: AC
  Filled 2018-03-14: qty 1

## 2018-03-14 MED ORDER — DIPHENHYDRAMINE HCL 50 MG/ML IJ SOLN
25.0000 mg | Freq: Once | INTRAMUSCULAR | Status: AC
  Administered 2018-03-14: 25 mg via INTRAVENOUS

## 2018-03-14 MED ORDER — KETOROLAC TROMETHAMINE 30 MG/ML IJ SOLN
30.0000 mg | Freq: Once | INTRAMUSCULAR | Status: AC
  Administered 2018-03-14: 30 mg via INTRAVENOUS
  Filled 2018-03-14: qty 1

## 2018-03-14 MED ORDER — DIPHENHYDRAMINE HCL 50 MG/ML IJ SOLN
25.0000 mg | Freq: Once | INTRAMUSCULAR | Status: AC
  Administered 2018-03-14: 25 mg via INTRAVENOUS
  Filled 2018-03-14: qty 1

## 2018-03-14 NOTE — ED Notes (Signed)
Patient transported to CT 

## 2018-03-14 NOTE — ED Triage Notes (Signed)
C/o left temporal HA x 2-3 hours-no meds taken PTA-steady gait

## 2018-03-14 NOTE — Discharge Instructions (Addendum)
Return here as needed.  Follow-up with your primary doctor.  Increase her fluid intake and rest as much as possible. °

## 2018-03-14 NOTE — ED Notes (Signed)
PT anxious. States sudden headache. Left side. No injury.

## 2018-03-14 NOTE — ED Notes (Signed)
ED Provider at bedside. 

## 2018-03-14 NOTE — ED Provider Notes (Signed)
[facility] EMERGENCY DEPARTMENT Provider Note   CSN: [id number] Arrival date & time: 03/14/18  1331     History   Chief Complaint Chief Complaint  Patient presents with  . Headache    HPI Craig Campbell is a 42 y.o. male.  HPI Patient presents to the emergency department with left-sided headache that occurred around 11 AM.  The patient states he was at work when this occurred.  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  Patient states that the headache did not radiate.  He states he did not have any visual changes.  He states that nothing seemed to make the condition better or worse.  Patient states he did not take any medications prior to arrival.  Patient states that he does not normally get headaches.  The patient denies chest pain, shortness of breath,blurred vision, neck pain, fever, cough, weakness, numbness, dizziness, anorexia, edema, abdominal pain, nausea, vomiting, diarrhea, rash, back pain, dysuria, hematemesis, bloody stool, near syncope, or syncope. Past Medical History:  Diagnosis Date  . Allergy   . Plantar fasciitis   . Psoriatic arthritis [facility])     Patient Active Problem List   Diagnosis Date Noted  . Streptococcal pharyngitis 07/22/2017  . Elevated blood pressure reading 06/24/2017  . Class 1 obesity due to excess calories without serious comorbidity in adult 06/24/2017  . Mixed hyperlipidemia 06/24/2017  . Plantar fasciitis, right 06/20/2017  . Psoriatic arthritis (HCC) 06/20/2017  . Synovitis of right fourth PIP 06/20/2017  . Allergic rhinitis 11/14/2013    Past Surgical History:  Procedure Laterality Date  . HERNIA REPAIR    . REFRACTIVE SURGERY          Home Medications    Prior to Admission medications   Medication Sig Start Date End Date Taking? Authorizing Provider  meloxicam (MOBIC) 15 MG tablet TAKE 1 TABLET BY MOUTH EVERY MORNING WITH BREAKFAST FOR 2 WEEKS THEN DAILY  AS NEEDED FOR PAIN 10/17/17   Monica Becton, MD  predniSONE (STERAPRED UNI-PAK 48 TAB) 10 MG (48) TBPK tablet Take by mouth daily. Use as directed for taper 09/26/17   Monica Becton, MD  Secukinumab 150 MG/ML SOAJ Inject into the skin.    [provider]  traMADol (ULTRAM) 50 MG tablet Take 1 tablet (50 mg total) by mouth every 8 (eight) hours as needed for moderate pain. Maximum 6 tabs per day. 09/20/17   Monica Becton, MD    Family History Family History  Problem Relation Age of Onset  . Hyperlipidemia Mother   . Stroke Mother   . Hypertension Mother   . Diabetes Mother   . Depression Mother   . Heart attack Paternal Uncle   . CAD Paternal Uncle     Social History Social History   Tobacco Use  . Smoking status: Never Smoker  . Smokeless tobacco: Never Used  Substance Use Topics  . Alcohol use: Yes    Comment: weekly  . Drug use: No     Allergies   Compazine [prochlorperazine edisylate]   Review of Systems Review of Systems  All other systems negative except as documented in the HPI. All pertinent positives and negatives as reviewed in the HPI. Physical Exam Updated Vital Signs BP (!) 153/109 (BP Location: Right Arm)   Pulse (!) 102   Temp 98.8 F (37.1 C) (Oral)   Resp 18   Ht 5\' 11"  (  1.803 m)   Wt 98.4 kg   SpO2 99%   BMI 30.27 kg/m   Physical Exam  Constitutional: He is oriented to person, place, and time. He appears well-developed and well-nourished. No distress.  HENT:  Head: Normocephalic and atraumatic.  Mouth/Throat: Oropharynx is clear and moist.  Eyes: Pupils are equal, round, and reactive to light.  Neck: Normal range of motion. Neck supple.  Cardiovascular: Normal rate, regular rhythm and normal heart sounds. Exam reveals no gallop and no friction rub.  No murmur heard. Pulmonary/Chest: Effort normal and breath sounds normal. No respiratory distress. He has no wheezes.  Abdominal: Soft. Bowel sounds are  normal. He exhibits no distension. There is no tenderness.  Neurological: He is alert and oriented to person, place, and time. He has normal strength. He is not disoriented. He displays normal reflexes. No cranial nerve deficit or sensory deficit. He exhibits normal muscle tone. Coordination and gait normal. GCS eye subscore is 4. GCS verbal subscore is 5. GCS motor subscore is 6.  Skin: Skin is warm and dry. Capillary refill takes less than 2 seconds. No rash noted. No erythema.  Psychiatric: His behavior is normal. His mood appears anxious.  Nursing note and vitals reviewed.    ED Treatments / Results  Labs (all labs ordered are listed, but only abnormal results are displayed) Labs Reviewed  BASIC METABOLIC PANEL  CBC WITH DIFFERENTIAL/PLATELET    EKG None  Radiology Ct Angio Head W Or Wo Contrast  Result Date: 03/14/2018 CLINICAL DATA:  Sudden onset left-sided headache. EXAM: CT ANGIOGRAPHY HEAD TECHNIQUE: Multidetector CT imaging of the head was performed using the standard protocol during bolus administration of intravenous contrast. Multiplanar CT image reconstructions and MIPs were obtained to evaluate the vascular anatomy. CONTRAST:  80mL ISOVUE-370 IOPAMIDOL (ISOVUE-370) INJECTION 76% COMPARISON:  None. FINDINGS: CT HEAD Brain: There is no evidence of acute infarct, intracranial hemorrhage, mass, midline shift, or extra-axial fluid collection. The ventricles and sulci are normal. Vascular: No hyperdense vessel. Skull: No fracture or focal osseous lesion. Sinuses: Visualized paranasal sinuses and mastoid air cells are clear. Orbits: Visualized portions are unremarkable. CTA HEAD Anterior circulation: The internal carotid arteries are widely patent from skull base to carotid termini. ACAs and MCAs are patent without evidence of proximal branch occlusion or significant proximal stenosis. The right A1 segment is hypoplastic. The anterior communicating artery is unremarkable. No aneurysm  is identified. Posterior circulation: The visualized distal vertebral arteries are patent with the left being dominant and supplying the basilar. The right vertebral artery ends in PICA. The basilar artery is widely patent. There is a fetal origin of the right PCA. Both PCAs are patent without evidence of significant stenosis. No aneurysm is identified. Venous sinuses: Patent. Anatomic variants: Fetal right PCA. Hypoplastic right vertebral artery ending in PICA. Hypoplastic right A1. Delayed phase: No abnormal enhancement. IMPRESSION: Unremarkable head CTA aside from normal variant anatomy. Electronically Signed   By: Sebastian Ache M.D.   On: 03/14/2018 15:23    Procedures Procedures (including critical care time)  Medications Ordered in ED Medications  sodium chloride 0.9 % bolus 1,000 mL (0 mLs Intravenous Stopped 03/14/18 1602)  iopamidol (ISOVUE-370) 76 % injection 100 mL (80 mLs Intravenous Contrast Given 03/14/18 1450)  ketorolac (TORADOL) 30 MG/ML injection 30 mg (30 mg Intravenous Given 03/14/18 1543)  prochlorperazine (COMPAZINE) injection 10 mg (10 mg Intravenous Given 03/14/18 1543)  diphenhydrAMINE (BENADRYL) injection 25 mg (25 mg Intravenous Given 03/14/18 1543)  diphenhydrAMINE (BENADRYL) injection 25  mg (25 mg Intravenous Given 03/14/18 1607)     Initial Impression / Assessment and Plan / ED Course  I have reviewed the triage vital signs and the nursing notes.  Pertinent labs & imaging results that were available during my care of the patient were reviewed by me and considered in my medical decision making (see chart for details).    Patient had a CTA of his head which did not show any abnormalities.  I did advise the patient that this could change in the knees keep a close eye on his symptoms.  The patient did get a migraine cocktail along with IV fluids and has complete resolution of his symptoms.  I advised the patient that at this point we do not have a clear explanation for  what caused the headache but this could be multifactorial and include slight dehydration as a possible inciting factor.  I advised the patient to return here for any worsening in his condition patient agrees with plan and all questions were answered. Final Clinical Impressions(s) / ED Diagnoses   Final diagnoses:  None    ED Discharge Orders    None       Charlestine Night, PA-C 03/14/18 1655    Azalia Bilis, MD 03/15/18 [phone]

## 2018-05-08 DIAGNOSIS — L4059 Other psoriatic arthropathy: Secondary | ICD-10-CM | POA: Diagnosis not present

## 2018-11-08 DIAGNOSIS — L4059 Other psoriatic arthropathy: Secondary | ICD-10-CM | POA: Diagnosis not present

## 2019-03-12 DIAGNOSIS — L4059 Other psoriatic arthropathy: Secondary | ICD-10-CM | POA: Diagnosis not present

## 2019-03-15 IMAGING — CT CT ANGIO HEAD
3 of 14 series · 16 of 47 positions shown · IV contrast (APPLIED)
Comparison: None.

CLINICAL DATA: Sudden onset left-sided headache.

EXAM:
CT ANGIOGRAPHY HEAD
TECHNIQUE: Multidetector CT imaging of the head was performed using the
standard protocol during bolus administration of intravenous
contrast. Multiplanar CT image reconstructions and MIPs were
obtained to evaluate the vascular anatomy.
CONTRAST:  80mL [id number] IOPAMIDOL (EE87VL-ZA7) INJECTION 76%

[Series 12: ax thin · axial · 0.35mm/px · z∈[-167,-39]mm · 11 of 154 slices shown]
[im 13/154  brain]
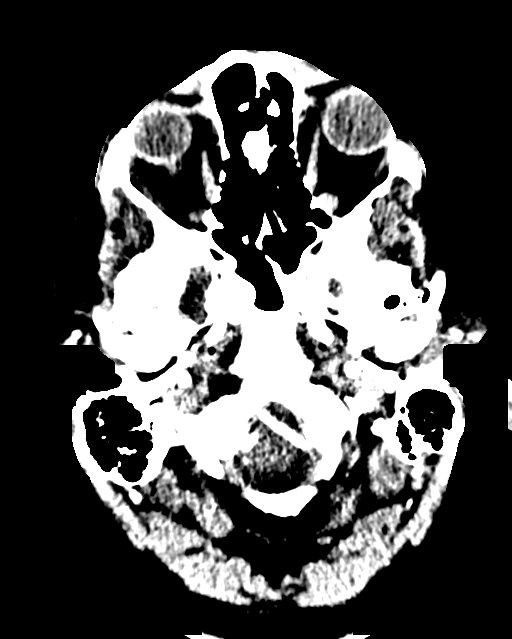
[im 26/154  bone]
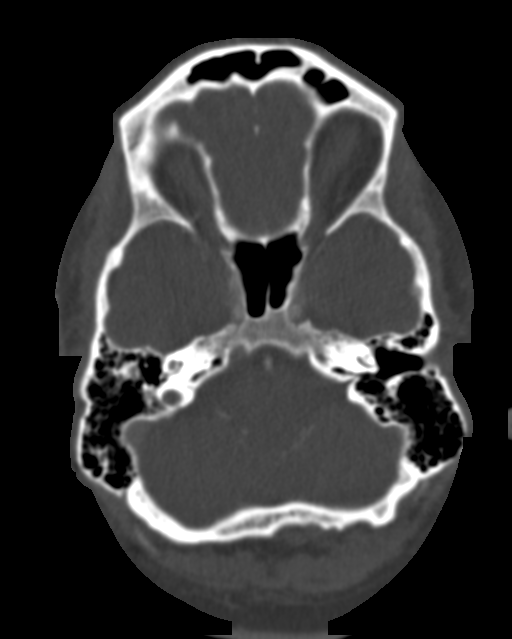
[im 39/154  brain]
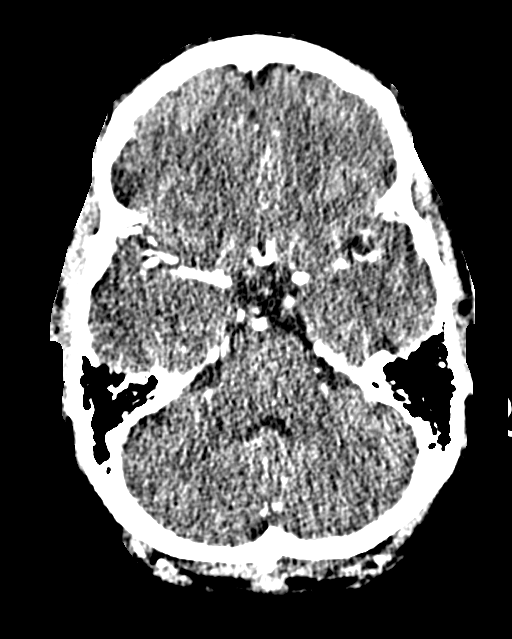
[im 52/154  bone]
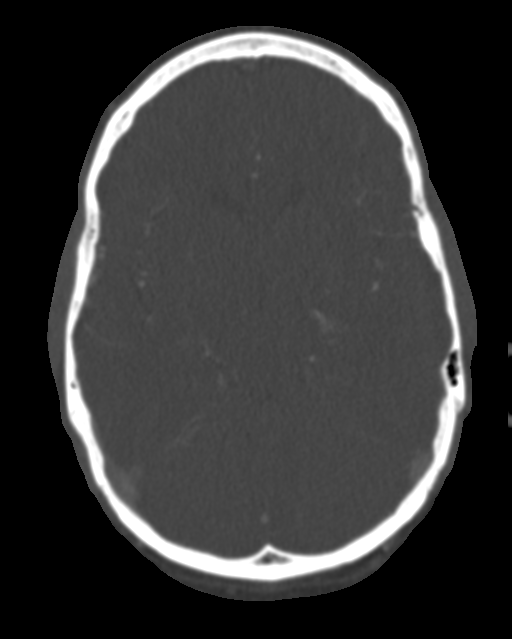
[im 64/154  brain]
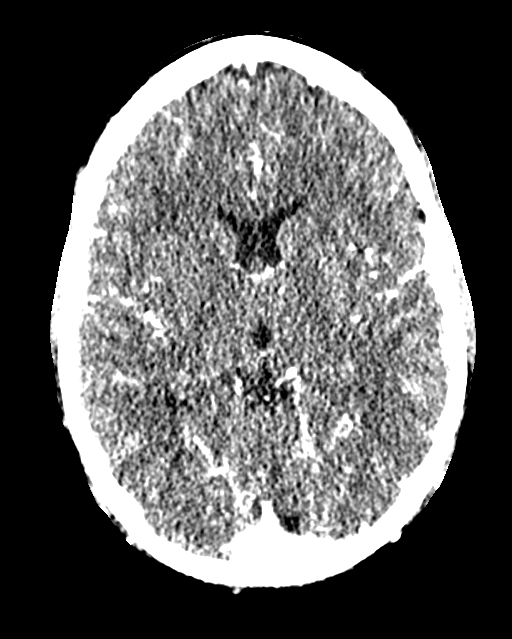
[im 77/154  bone]
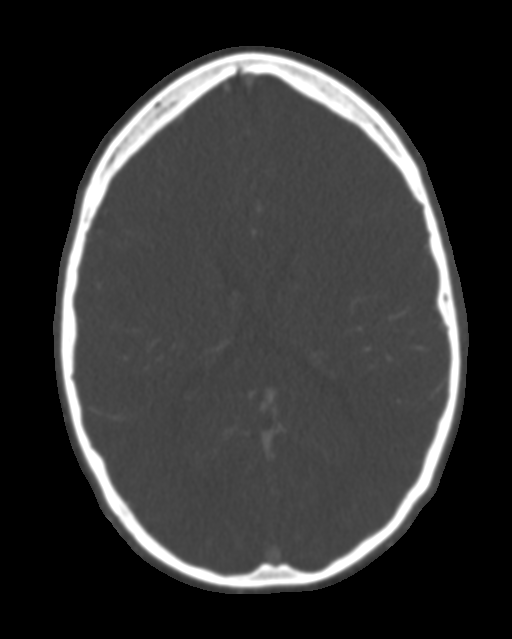
[im 90/154  brain]
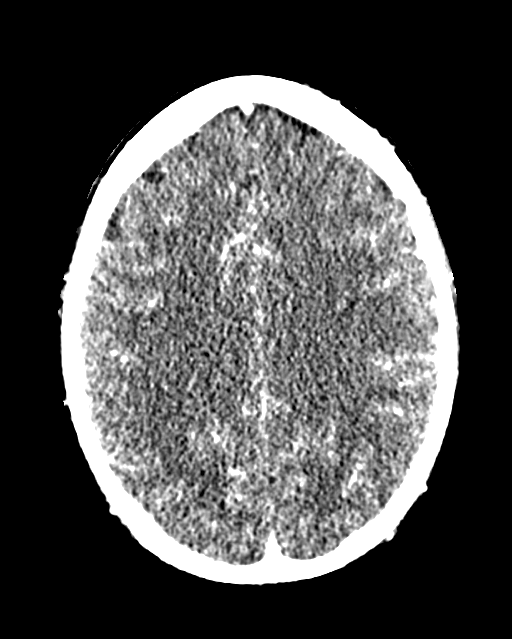
[im 103/154  bone]
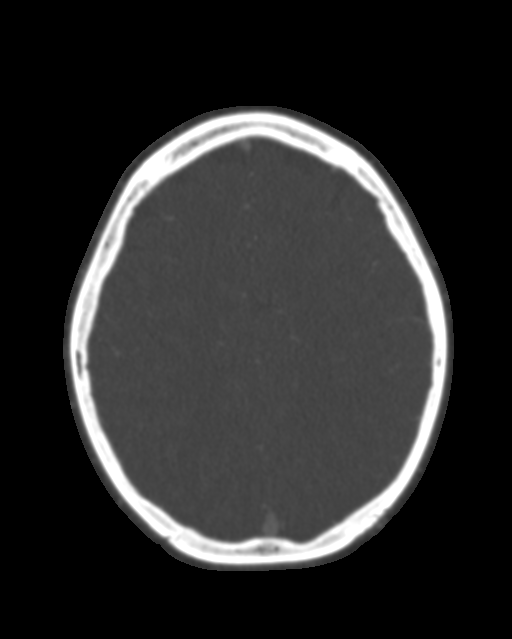
[im 115/154  brain]
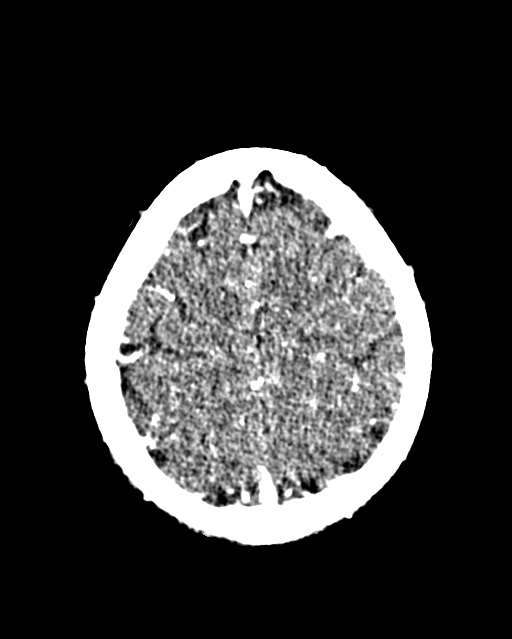
[im 128/154  bone]
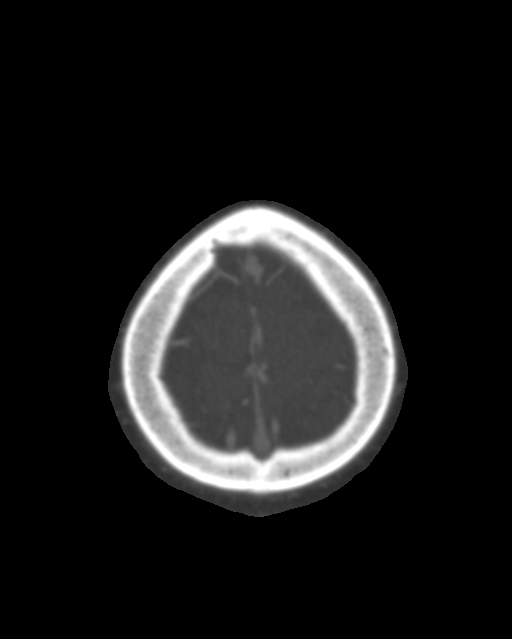
[im 141/154  brain]
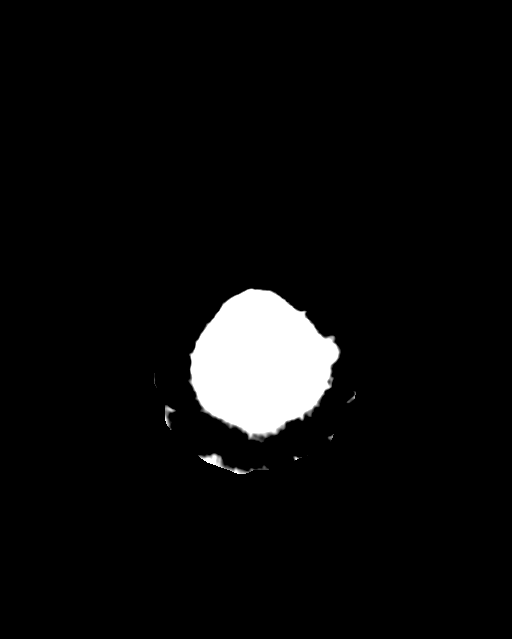

[Series 14: cor thin · coronal · 0.37mm/px · 3 of 244 slices shown]
[im 49/244  brain]
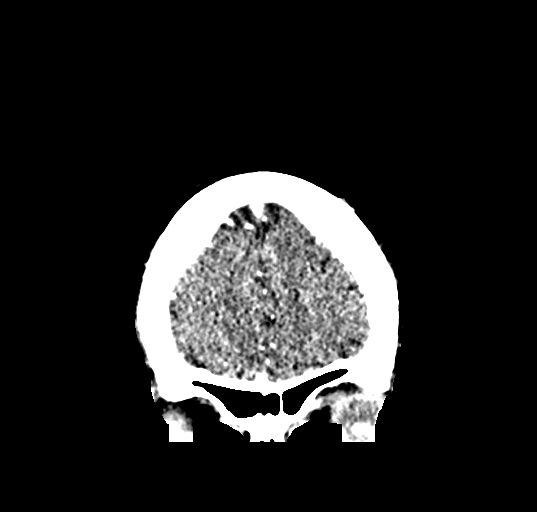
[im 98/244  brain]
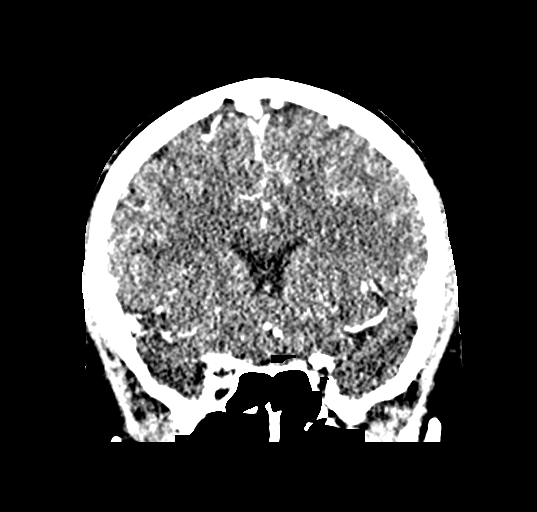
[im 146/244  brain]
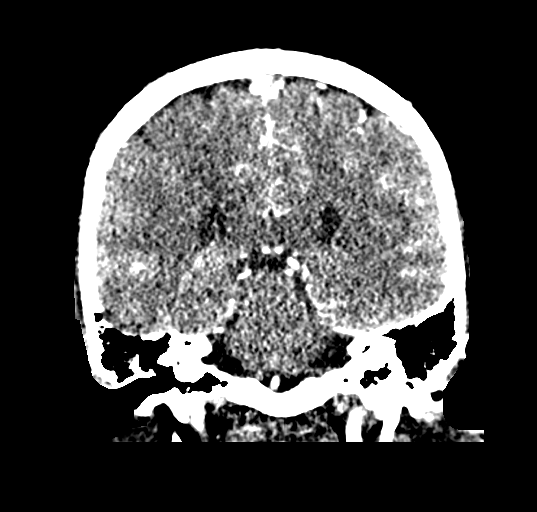

[Series 16: sag thin · sagittal · 0.34mm/px · 2 of 166 slices shown]
[im 56/166  brain]
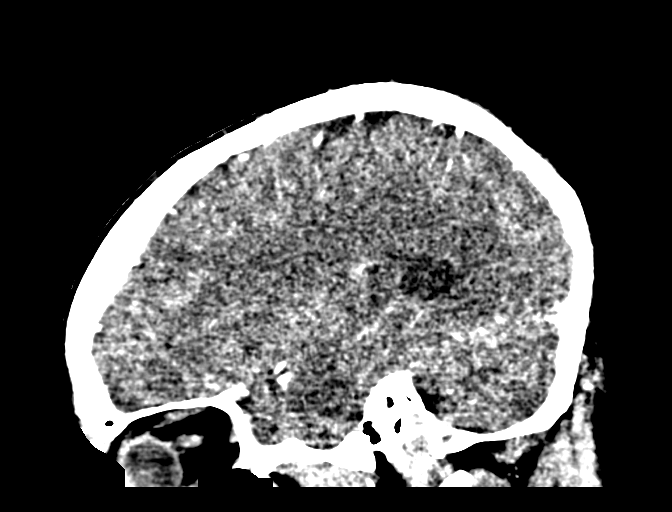
[im 111/166  brain]
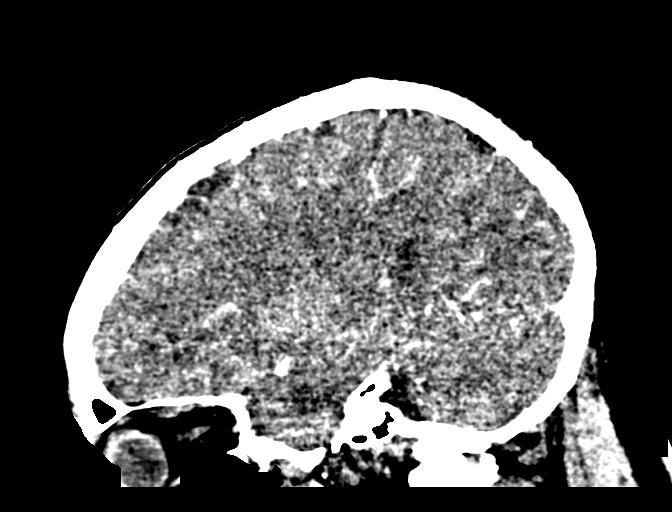

[16 of 47 positions shown; findings below may reference images not displayed]

FINDINGS: CT HEAD

Brain: There is no evidence of acute infarct, intracranial
hemorrhage, mass, midline shift, or extra-axial fluid collection.
The ventricles and sulci are normal.

Vascular: No hyperdense vessel.

Skull: No fracture or focal osseous lesion.

Sinuses: Visualized paranasal sinuses and mastoid air cells are
clear.

Orbits: Visualized portions are unremarkable.

CTA HEAD

Anterior circulation: The internal carotid arteries are widely
patent from skull base to carotid termini. ACAs and MCAs are patent
without evidence of proximal branch occlusion or significant
proximal stenosis. The right A1 segment is hypoplastic. The anterior
communicating artery is unremarkable. No aneurysm is identified.

Posterior circulation: The visualized distal vertebral arteries are
patent with the left being dominant and supplying the basilar. The
right vertebral artery ends in PICA. The basilar artery is widely
patent. There is a fetal origin of the right PCA. Both PCAs are
patent without evidence of significant stenosis. No aneurysm is
identified.

Venous sinuses: Patent.

Anatomic variants: Fetal right PCA. Hypoplastic right vertebral
artery ending in PICA. Hypoplastic right A1.

Delayed phase: No abnormal enhancement.
IMPRESSION: Unremarkable head CTA aside from normal variant anatomy.

## 2019-03-22 ENCOUNTER — Telehealth: Payer: Self-pay

## 2019-03-22 ENCOUNTER — Other Ambulatory Visit: Payer: Self-pay

## 2019-03-22 DIAGNOSIS — Z20822 Contact with and (suspected) exposure to covid-19: Secondary | ICD-10-CM

## 2019-03-22 NOTE — Telephone Encounter (Signed)
Tytus called and states he was exposed to COVID-19. He is going to the testing site in [place].

## 2019-03-22 NOTE — Telephone Encounter (Signed)
Noted  

## 2019-03-23 DIAGNOSIS — Z6830 Body mass index (BMI) 30.0-30.9, adult: Secondary | ICD-10-CM | POA: Diagnosis not present

## 2019-03-23 DIAGNOSIS — Z20828 Contact with and (suspected) exposure to other viral communicable diseases: Secondary | ICD-10-CM | POA: Diagnosis not present

## 2019-03-24 LAB — NOVEL CORONAVIRUS, NAA: SARS-CoV-2, NAA: NOT DETECTED
